# Patient Record
Sex: Female | Born: 1990 | Race: Black or African American | Hispanic: No | Marital: Single | Smoking: Light tobacco smoker
Health system: Southern US, Community
[De-identification: ages and names within clinical notes are randomized; demographics above are authoritative.]

## PROBLEM LIST (undated history)

## (undated) DIAGNOSIS — B977 Papillomavirus as the cause of diseases classified elsewhere: Secondary | ICD-10-CM

## (undated) DIAGNOSIS — A599 Trichomoniasis, unspecified: Secondary | ICD-10-CM

## (undated) DIAGNOSIS — A749 Chlamydial infection, unspecified: Secondary | ICD-10-CM

## (undated) DIAGNOSIS — L0292 Furuncle, unspecified: Secondary | ICD-10-CM

## (undated) DIAGNOSIS — L0293 Carbuncle, unspecified: Secondary | ICD-10-CM

## (undated) HISTORY — PX: NOSE SURGERY: SHX723

---

## 2013-01-12 ENCOUNTER — Encounter (HOSPITAL_COMMUNITY): Payer: Self-pay | Admitting: *Deleted

## 2013-01-12 ENCOUNTER — Emergency Department (HOSPITAL_COMMUNITY)
Admission: EM | Admit: 2013-01-12 | Discharge: 2013-01-12 | Disposition: A | Discharge status: Home / Self Care | Payer: 59 | Attending: Emergency Medicine | Admitting: Emergency Medicine

## 2013-01-12 ENCOUNTER — Emergency Department (HOSPITAL_COMMUNITY): Payer: 59

## 2013-01-12 DIAGNOSIS — F17200 Nicotine dependence, unspecified, uncomplicated: Secondary | ICD-10-CM | POA: Insufficient documentation

## 2013-01-12 DIAGNOSIS — R61 Generalized hyperhidrosis: Secondary | ICD-10-CM | POA: Insufficient documentation

## 2013-01-12 DIAGNOSIS — N12 Tubulo-interstitial nephritis, not specified as acute or chronic: Secondary | ICD-10-CM

## 2013-01-12 DIAGNOSIS — R5381 Other malaise: Secondary | ICD-10-CM | POA: Insufficient documentation

## 2013-01-12 DIAGNOSIS — R05 Cough: Secondary | ICD-10-CM | POA: Insufficient documentation

## 2013-01-12 DIAGNOSIS — R110 Nausea: Secondary | ICD-10-CM | POA: Insufficient documentation

## 2013-01-12 DIAGNOSIS — R0789 Other chest pain: Secondary | ICD-10-CM | POA: Insufficient documentation

## 2013-01-12 DIAGNOSIS — R6883 Chills (without fever): Secondary | ICD-10-CM | POA: Insufficient documentation

## 2013-01-12 DIAGNOSIS — N72 Inflammatory disease of cervix uteri: Secondary | ICD-10-CM | POA: Insufficient documentation

## 2013-01-12 DIAGNOSIS — N898 Other specified noninflammatory disorders of vagina: Secondary | ICD-10-CM | POA: Insufficient documentation

## 2013-01-12 DIAGNOSIS — R059 Cough, unspecified: Secondary | ICD-10-CM | POA: Insufficient documentation

## 2013-01-12 DIAGNOSIS — M25559 Pain in unspecified hip: Secondary | ICD-10-CM | POA: Insufficient documentation

## 2013-01-12 LAB — COMPREHENSIVE METABOLIC PANEL
ALT: 9 U/L (ref 0–35)
AST: 12 U/L (ref 0–37)
Albumin: 3.5 g/dL (ref 3.5–5.2)
Alkaline Phosphatase: 74 U/L (ref 39–117)
Glucose, Bld: 99 mg/dL (ref 70–99)
Potassium: 3.8 mEq/L (ref 3.5–5.1)
Sodium: 136 mEq/L (ref 135–145)
Total Protein: 8.6 g/dL — ABNORMAL HIGH (ref 6.0–8.3)

## 2013-01-12 LAB — CBC WITH DIFFERENTIAL/PLATELET
Basophils Absolute: 0 10*3/uL (ref 0.0–0.1)
Basophils Relative: 0 % (ref 0–1)
Eosinophils Absolute: 0.1 10*3/uL (ref 0.0–0.7)
Lymphs Abs: 1.5 10*3/uL (ref 0.7–4.0)
MCH: 27 pg (ref 26.0–34.0)
Neutrophils Relative %: 69 % (ref 43–77)
Platelets: 460 10*3/uL — ABNORMAL HIGH (ref 150–400)
RBC: 4.33 MIL/uL (ref 3.87–5.11)
WBC: 7 10*3/uL (ref 4.0–10.5)

## 2013-01-12 LAB — URINALYSIS, ROUTINE W REFLEX MICROSCOPIC
Bilirubin Urine: NEGATIVE
Glucose, UA: NEGATIVE mg/dL
Specific Gravity, Urine: 1.029 (ref 1.005–1.030)
pH: 6 (ref 5.0–8.0)

## 2013-01-12 LAB — WET PREP, GENITAL: Yeast Wet Prep HPF POC: NONE SEEN

## 2013-01-12 MED ORDER — DEXTROSE 5 % IV SOLN
1.0000 g | Freq: Once | INTRAVENOUS | Status: AC
  Administered 2013-01-12: 1 g via INTRAVENOUS
  Filled 2013-01-12: qty 10

## 2013-01-12 MED ORDER — TRAMADOL HCL 50 MG PO TABS: 50.0000 mg | ORAL_TABLET | Freq: Four times a day (QID) | ORAL | Status: DC | PRN

## 2013-01-12 MED ORDER — MORPHINE SULFATE 4 MG/ML IJ SOLN
4.0000 mg | Freq: Once | INTRAMUSCULAR | Status: AC
  Administered 2013-01-12: 4 mg via INTRAVENOUS
  Filled 2013-01-12: qty 1

## 2013-01-12 MED ORDER — ONDANSETRON HCL 4 MG/2ML IJ SOLN
4.0000 mg | Freq: Once | INTRAMUSCULAR | Status: AC
  Administered 2013-01-12: 4 mg via INTRAVENOUS
  Filled 2013-01-12: qty 2

## 2013-01-12 MED ORDER — CEFTRIAXONE SODIUM 1 G IJ SOLR: 1.0000 g | Freq: Once | INTRAMUSCULAR | Status: DC

## 2013-01-12 MED ORDER — CEPHALEXIN 500 MG PO CAPS: 500.0000 mg | ORAL_CAPSULE | Freq: Two times a day (BID) | ORAL | Status: DC

## 2013-01-12 MED ORDER — DOXYCYCLINE HYCLATE 100 MG PO CAPS: 100.0000 mg | ORAL_CAPSULE | Freq: Two times a day (BID) | ORAL | Status: DC

## 2013-01-12 NOTE — ED Notes (Signed)
Pt states that she began having right sided pain, back pain, adn sweating earlier in the week. Pt states that she began having upper chest pain last night. Pain is intermittent and worse with breathing. Started while at rest.

## 2013-01-12 NOTE — ED Provider Notes (Signed)
History     CSN: [id number]  Arrival date & time 01/12/13  0948   First MD Initiated Contact with Patient 01/12/13 1000      Chief Complaint  Patient presents with  . Chest Pain    (Consider location/radiation/quality/duration/timing/severity/associated sxs/prior treatment) HPI Jennifer Daugherty is a 22 y.o. female who presents with complaint of right sided abdominal/back pain, chest pain, chills, sweats, bilateral hip and thigh aches. Pt states symptoms began few days ago but worsned since yesterda. States pain to the right upper quadrant and flank pain. States nauseated, sweats. No fever, no vomiting. No changes in bowels. States no vaginal discharge but she is on her period. Admits to cough. Taken ibuprofen with no relief. States feels very achy in pelvic, hips, thighs area.  History reviewed. No pertinent past medical history.  Past Surgical History  Procedure Date  . Nose surgery     No family history on file.  History  Substance Use Topics  . Smoking status: Current Every Day Smoker  . Smokeless tobacco: Not on file  . Alcohol Use: Yes    OB History    Grav Para Term Preterm Abortions TAB SAB Ect Mult Living                  Review of Systems  Constitutional: Positive for chills and diaphoresis. Negative for fever.  HENT: Negative for ear pain, congestion, sore throat, neck pain and neck stiffness.   Eyes: Negative.   Respiratory: Positive for cough and chest tightness. Negative for shortness of breath and wheezing.   Cardiovascular: Positive for chest pain. Negative for palpitations and leg swelling.  Gastrointestinal: Positive for nausea and abdominal pain. Negative for vomiting, diarrhea and constipation.  Genitourinary: Positive for flank pain, vaginal bleeding and pelvic pain. Negative for dysuria.  Musculoskeletal: Positive for myalgias and arthralgias.  Skin: Negative for rash.  Neurological: Positive for weakness. Negative for dizziness, light-headedness and  headaches.  Hematological: Negative.     Allergies  Review of patient's allergies indicates no known allergies.  Home Medications   Current Outpatient Rx  Name  Route  Sig  Dispense  Refill  . IBUPROFEN 200 MG PO TABS   Oral   Take 400 mg by mouth every 6 (six) hours as needed. For pain           BP 118/69  Pulse 78  Temp 98.5 F (36.9 C) (Oral)  Resp 22  SpO2 100%  LMP 01/05/2013  Physical Exam  Nursing note and vitals reviewed. Constitutional: She appears well-developed and well-nourished. No distress.  HENT:  Head: Normocephalic and atraumatic.  Right Ear: External ear normal.  Left Ear: External ear normal.  Mouth/Throat: Oropharynx is clear and moist.  Eyes: Conjunctivae normal are normal.  Neck: Normal range of motion. Neck supple.  Cardiovascular: Normal rate, regular rhythm and normal heart sounds.   Pulmonary/Chest: Effort normal and breath sounds normal. No respiratory distress. She has no wheezes. She has no rales.  Abdominal: Soft. Bowel sounds are normal. She exhibits no distension. There is tenderness. There is no rebound.       RUQ and suprapubic tenderness. Right CVA tenderness  Genitourinary:       Normal external genitalia. Normal vaginal canal except for what and brown discharge. CMT present. Uterine tenderness. No adnexal tenderness  Musculoskeletal: She exhibits no edema.  Neurological: She is alert.  Skin: Skin is dry.    ED Course  Procedures (including critical care time)  Pt with RUQ  abd pain, right CVA tenderness. Pt afebrile. Non toxic. Also complaining of non specific chest pain. Will get UA, labs, CXR.   Results for orders placed during the hospital encounter of 01/12/13  CBC WITH DIFFERENTIAL      Component Value Range   WBC 7.0  4.0 - 10.5 K/uL   RBC 4.33  3.87 - 5.11 MIL/uL   Hemoglobin 11.7 (*) 12.0 - 15.0 g/dL   HCT 14.7 (*) 82.9 - 56.2 %   MCV 81.3  78.0 - 100.0 fL   MCH 27.0  26.0 - 34.0 pg   MCHC 33.2  30.0 - 36.0 g/dL    RDW 13.0 (*) 86.5 - 15.5 %   Platelets 460 (*) 150 - 400 K/uL   Neutrophils Relative 69  43 - 77 %   Neutro Abs 4.8  1.7 - 7.7 K/uL   Lymphocytes Relative 21  12 - 46 %   Lymphs Abs 1.5  0.7 - 4.0 K/uL   Monocytes Relative 9  3 - 12 %   Monocytes Absolute 0.7  0.1 - 1.0 K/uL   Eosinophils Relative 1  0 - 5 %   Eosinophils Absolute 0.1  0.0 - 0.7 K/uL   Basophils Relative 0  0 - 1 %   Basophils Absolute 0.0  0.0 - 0.1 K/uL  COMPREHENSIVE METABOLIC PANEL      Component Value Range   Sodium 136  135 - 145 mEq/L   Potassium 3.8  3.5 - 5.1 mEq/L   Chloride 100  96 - 112 mEq/L   CO2 25  19 - 32 mEq/L   Glucose, Bld 99  70 - 99 mg/dL   BUN 5 (*) 6 - 23 mg/dL   Creatinine, Ser 7.84  0.50 - 1.10 mg/dL   Calcium 9.5  8.4 - 69.6 mg/dL   Total Protein 8.6 (*) 6.0 - 8.3 g/dL   Albumin 3.5  3.5 - 5.2 g/dL   AST 12  0 - 37 U/L   ALT 9  0 - 35 U/L   Alkaline Phosphatase 74  39 - 117 U/L   Total Bilirubin 0.4  0.3 - 1.2 mg/dL   GFR calc non Af Amer >90  >90 mL/min   GFR calc Af Amer >90  >90 mL/min  LIPASE, BLOOD      Component Value Range   Lipase 14  11 - 59 U/L  URINALYSIS, ROUTINE W REFLEX MICROSCOPIC      Component Value Range   Color, Urine RED (*) YELLOW   APPearance TURBID (*) CLEAR   Specific Gravity, Urine 1.029  1.005 - 1.030   pH 6.0  5.0 - 8.0   Glucose, UA NEGATIVE  NEGATIVE mg/dL   Hgb urine dipstick LARGE (*) NEGATIVE   Bilirubin Urine NEGATIVE  NEGATIVE   Ketones, ur 15 (*) NEGATIVE mg/dL   Protein, ur 295 (*) NEGATIVE mg/dL   Urobilinogen, UA 1.0  0.0 - 1.0 mg/dL   Nitrite NEGATIVE  NEGATIVE   Leukocytes, UA LARGE (*) NEGATIVE  WET PREP, GENITAL      Component Value Range   Yeast Wet Prep HPF POC NONE SEEN  NONE SEEN   Trich, Wet Prep NONE SEEN  NONE SEEN   Clue Cells Wet Prep HPF POC MODERATE (*) NONE SEEN   WBC, Wet Prep HPF POC MANY (*) NONE SEEN  POCT PREGNANCY, URINE      Component Value Range   Preg Test, Ur NEGATIVE  NEGATIVE  URINE MICROSCOPIC-ADD  ON  Component Value Range   Squamous Epithelial / LPF MANY (*) RARE   WBC, UA 21-50  <3 WBC/hpf   RBC / HPF TOO NUMEROUS TO COUNT  <3 RBC/hpf   Bacteria, UA MANY (*) RARE   Dg Chest 2 View  01/12/2013  *RADIOLOGY REPORT*  Clinical Data: Chest pain  CHEST - 2 VIEW  Comparison: None.  Findings: Lungs clear.  Heart size and pulmonary vascularity are normal.  No adenopathy.  No bone lesions.  No pneumothorax.  IMPRESSION: No abnormality noted.   Original Report Authenticated By: Bretta Bang, M.D.     Date: 01/12/2013  Rate: 79  Rhythm: normal sinus rhythm  QRS Axis: normal  Intervals: normal  ST/T Wave abnormalities: normal  Conduction Disutrbances: none  Narrative Interpretation:   Old EKG Reviewed: no old      1. Cervicitis   2. Pyelonephritis       MDM  Pt with abdominal tenderness, right flank pain. LFTs and lipase normal. No vomiting. Doubt cholecystitis. Still possible cholelithiasis. Pt does have infected UA and bacteria on wet prep. Suspect UTI vs pyelonephritis and possible cervicitis vs PID. Pt's VS are normal. She is afebrile. Not vomiting. Will treat for both pelvic infection and pyelonephritis outpatient. Given rocephin 1g IVPB. Pt non toxic otherwise.. No acute abdomen. Stable for d/c home. CXR non specific, none at this time.   Filed Vitals:   01/12/13 1015 01/12/13 1226 01/12/13 1300 01/12/13 1400  BP: 94/51 106/68 108/72 95/52  Pulse: 80 67 76 69  Temp:  98.1 F (36.7 C)    TempSrc:  Oral    Resp: 20 20    SpO2: 97% 98% 98% 98%           Lottie Mussel, PA 01/12/13 1502

## 2013-01-12 NOTE — ED Notes (Signed)
Pt taken to radiology

## 2013-01-13 LAB — URINE CULTURE: Culture: NO GROWTH

## 2013-01-14 LAB — GC/CHLAMYDIA PROBE AMP
CT Probe RNA: POSITIVE — AB
GC Probe RNA: NEGATIVE

## 2013-01-15 ENCOUNTER — Telehealth (HOSPITAL_COMMUNITY): Payer: Self-pay | Admitting: Emergency Medicine

## 2013-01-15 NOTE — ED Notes (Signed)
+  Chlamydia. Patient treated with Rocephin and Doxycycline. Per protocol MD. [facility] faxed. °

## 2013-01-15 NOTE — ED Notes (Signed)
Patient had +Chlamydia. Checking to see if appropriate treatment provided.

## 2013-01-16 NOTE — ED Provider Notes (Signed)
Medical screening examination/treatment/procedure(s) were performed by non-physician practitioner and as supervising physician I was immediately available for consultation/collaboration.   Suzi Roots, MD 01/16/13 [phone]

## 2014-08-01 ENCOUNTER — Emergency Department (HOSPITAL_COMMUNITY)
Admission: EM | Admit: 2014-08-01 | Discharge: 2014-08-02 | Discharge status: Against Medical Advice | Payer: 59 | Attending: Emergency Medicine | Admitting: Emergency Medicine

## 2014-08-01 ENCOUNTER — Encounter (HOSPITAL_COMMUNITY): Payer: Self-pay | Admitting: Emergency Medicine

## 2014-08-01 DIAGNOSIS — R109 Unspecified abdominal pain: Secondary | ICD-10-CM | POA: Diagnosis not present

## 2014-08-01 HISTORY — DX: Trichomoniasis, unspecified: A59.9

## 2014-08-01 HISTORY — DX: Furuncle, unspecified: L02.92

## 2014-08-01 HISTORY — DX: Carbuncle, unspecified: L02.93

## 2014-08-01 HISTORY — DX: Chlamydial infection, unspecified: A74.9

## 2014-08-01 HISTORY — DX: Papillomavirus as the cause of diseases classified elsewhere: B97.7

## 2014-08-01 LAB — CBC WITH DIFFERENTIAL/PLATELET
Basophils Absolute: 0 10*3/uL (ref 0.0–0.1)
Basophils Relative: 0 % (ref 0–1)
EOS PCT: 4 % (ref 0–5)
Eosinophils Absolute: 0.2 10*3/uL (ref 0.0–0.7)
HCT: 35.9 % — ABNORMAL LOW (ref 36.0–46.0)
HEMOGLOBIN: 11.4 g/dL — AB (ref 12.0–15.0)
LYMPHS ABS: 2.7 10*3/uL (ref 0.7–4.0)
LYMPHS PCT: 49 % — AB (ref 12–46)
MCH: 26.5 pg (ref 26.0–34.0)
MCHC: 31.8 g/dL (ref 30.0–36.0)
MCV: 83.3 fL (ref 78.0–100.0)
Monocytes Absolute: 0.3 10*3/uL (ref 0.1–1.0)
Monocytes Relative: 6 % (ref 3–12)
Neutro Abs: 2.3 10*3/uL (ref 1.7–7.7)
Neutrophils Relative %: 41 % — ABNORMAL LOW (ref 43–77)
PLATELETS: 457 10*3/uL — AB (ref 150–400)
RBC: 4.31 MIL/uL (ref 3.87–5.11)
RDW: 15.5 % (ref 11.5–15.5)
WBC: 5.5 10*3/uL (ref 4.0–10.5)

## 2014-08-01 LAB — COMPREHENSIVE METABOLIC PANEL
ALK PHOS: 68 U/L (ref 39–117)
ALT: 8 U/L (ref 0–35)
AST: 16 U/L (ref 0–37)
Albumin: 3.5 g/dL (ref 3.5–5.2)
Anion gap: 13 (ref 5–15)
BUN: 6 mg/dL (ref 6–23)
CO2: 24 meq/L (ref 19–32)
Calcium: 9.3 mg/dL (ref 8.4–10.5)
Chloride: 103 mEq/L (ref 96–112)
Creatinine, Ser: 0.72 mg/dL (ref 0.50–1.10)
GLUCOSE: 81 mg/dL (ref 70–99)
POTASSIUM: 3.3 meq/L — AB (ref 3.7–5.3)
SODIUM: 140 meq/L (ref 137–147)
Total Bilirubin: 0.3 mg/dL (ref 0.3–1.2)
Total Protein: 7.8 g/dL (ref 6.0–8.3)

## 2014-08-01 LAB — I-STAT TROPONIN, ED: Troponin i, poc: 0 ng/mL (ref 0.00–0.08)

## 2014-08-01 LAB — LIPASE, BLOOD: Lipase: 18 U/L (ref 11–59)

## 2014-08-01 NOTE — ED Notes (Signed)
Pt came up to desk and returned pager, stated she was leaving, did not stay for conversation, ambulatory without distress out of department

## 2014-08-01 NOTE — ED Notes (Signed)
The patient said she had not been able to have a BM for two weeks and has been having AB pain.  She says yesterday she had diarrhea and had been nauseated.  The patient also says she has a "knot" on her right side.  She says she is having cramping and rates her pain 10/10.

## 2014-08-10 ENCOUNTER — Emergency Department (HOSPITAL_COMMUNITY)
Admission: EM | Admit: 2014-08-10 | Discharge: 2014-08-10 | Disposition: A | Discharge status: Home / Self Care | Payer: 59 | Attending: Emergency Medicine | Admitting: Emergency Medicine

## 2014-08-10 ENCOUNTER — Encounter (HOSPITAL_COMMUNITY): Payer: Self-pay | Admitting: Emergency Medicine

## 2014-08-10 ENCOUNTER — Emergency Department (HOSPITAL_COMMUNITY): Payer: 59

## 2014-08-10 DIAGNOSIS — Z8619 Personal history of other infectious and parasitic diseases: Secondary | ICD-10-CM | POA: Insufficient documentation

## 2014-08-10 DIAGNOSIS — K5900 Constipation, unspecified: Secondary | ICD-10-CM | POA: Diagnosis not present

## 2014-08-10 DIAGNOSIS — Z3202 Encounter for pregnancy test, result negative: Secondary | ICD-10-CM | POA: Diagnosis not present

## 2014-08-10 DIAGNOSIS — F17200 Nicotine dependence, unspecified, uncomplicated: Secondary | ICD-10-CM | POA: Diagnosis not present

## 2014-08-10 DIAGNOSIS — R109 Unspecified abdominal pain: Secondary | ICD-10-CM | POA: Insufficient documentation

## 2014-08-10 DIAGNOSIS — R1033 Periumbilical pain: Secondary | ICD-10-CM | POA: Diagnosis not present

## 2014-08-10 DIAGNOSIS — Z872 Personal history of diseases of the skin and subcutaneous tissue: Secondary | ICD-10-CM | POA: Diagnosis not present

## 2014-08-10 DIAGNOSIS — R1032 Left lower quadrant pain: Secondary | ICD-10-CM | POA: Diagnosis not present

## 2014-08-10 DIAGNOSIS — R197 Diarrhea, unspecified: Secondary | ICD-10-CM | POA: Diagnosis not present

## 2014-08-10 DIAGNOSIS — K921 Melena: Secondary | ICD-10-CM | POA: Insufficient documentation

## 2014-08-10 LAB — CBC WITH DIFFERENTIAL/PLATELET
Basophils Absolute: 0 10*3/uL (ref 0.0–0.1)
Basophils Relative: 0 % (ref 0–1)
EOS ABS: 0.1 10*3/uL (ref 0.0–0.7)
EOS PCT: 1 % (ref 0–5)
HCT: 35.5 % — ABNORMAL LOW (ref 36.0–46.0)
HEMOGLOBIN: 11.6 g/dL — AB (ref 12.0–15.0)
Lymphocytes Relative: 12 % (ref 12–46)
Lymphs Abs: 1.2 10*3/uL (ref 0.7–4.0)
MCH: 26.4 pg (ref 26.0–34.0)
MCHC: 32.7 g/dL (ref 30.0–36.0)
MCV: 80.9 fL (ref 78.0–100.0)
MONOS PCT: 6 % (ref 3–12)
Monocytes Absolute: 0.6 10*3/uL (ref 0.1–1.0)
Neutro Abs: 7.9 10*3/uL — ABNORMAL HIGH (ref 1.7–7.7)
Neutrophils Relative %: 81 % — ABNORMAL HIGH (ref 43–77)
PLATELETS: 447 10*3/uL — AB (ref 150–400)
RBC: 4.39 MIL/uL (ref 3.87–5.11)
RDW: 15 % (ref 11.5–15.5)
WBC: 9.8 10*3/uL (ref 4.0–10.5)

## 2014-08-10 LAB — COMPREHENSIVE METABOLIC PANEL
ALT: 6 U/L (ref 0–35)
ANION GAP: 16 — AB (ref 5–15)
AST: 10 U/L (ref 0–37)
Albumin: 3.7 g/dL (ref 3.5–5.2)
Alkaline Phosphatase: 74 U/L (ref 39–117)
BILIRUBIN TOTAL: 0.4 mg/dL (ref 0.3–1.2)
BUN: 7 mg/dL (ref 6–23)
CHLORIDE: 99 meq/L (ref 96–112)
CO2: 21 meq/L (ref 19–32)
CREATININE: 0.69 mg/dL (ref 0.50–1.10)
Calcium: 9.5 mg/dL (ref 8.4–10.5)
GFR calc Af Amer: >90 mL/min (ref >90)
GFR calc non Af Amer: >90 mL/min (ref >90)
Glucose, Bld: 100 mg/dL — ABNORMAL HIGH (ref 70–99)
Potassium: 3.6 mEq/L — ABNORMAL LOW (ref 3.7–5.3)
Sodium: 136 mEq/L — ABNORMAL LOW (ref 137–147)
Total Protein: 8.1 g/dL (ref 6.0–8.3)

## 2014-08-10 LAB — URINALYSIS, ROUTINE W REFLEX MICROSCOPIC
BILIRUBIN URINE: NEGATIVE
GLUCOSE, UA: NEGATIVE mg/dL
Hgb urine dipstick: NEGATIVE
KETONES UR: NEGATIVE mg/dL
Leukocytes, UA: NEGATIVE
Nitrite: NEGATIVE
Protein, ur: NEGATIVE mg/dL
Specific Gravity, Urine: 1.022 (ref 1.005–1.030)
Urobilinogen, UA: 1 mg/dL (ref 0.0–1.0)
pH: 8 (ref 5.0–8.0)

## 2014-08-10 LAB — POC URINE PREG, ED: Preg Test, Ur: NEGATIVE

## 2014-08-10 MED ORDER — METOCLOPRAMIDE HCL 5 MG/ML IJ SOLN
10.0000 mg | Freq: Once | INTRAMUSCULAR | Status: AC
  Administered 2014-08-10: 10 mg via INTRAVENOUS
  Filled 2014-08-10: qty 2

## 2014-08-10 MED ORDER — SODIUM CHLORIDE 0.9 % IV BOLUS (SEPSIS)
1000.0000 mL | Freq: Once | INTRAVENOUS | Status: AC
  Administered 2014-08-10: 1000 mL via INTRAVENOUS

## 2014-08-10 NOTE — ED Notes (Signed)
MD at bedside. 

## 2014-08-10 NOTE — ED Provider Notes (Signed)
CSN: [id number]     Arrival date & time 08/10/14  0153 History   First MD Initiated Contact with Patient 08/10/14 [phone]     Chief Complaint  Patient presents with  . Abdominal Pain   HPI  History provided by the patient. The patient is a 23 year old female presenting with complaints of diarrhea. Patient reports having problems with constipation for the past 3 weeks. Several days ago she was finally able to have a bowel movement but reports having diarrhea. She has not had any significant bowel movement since that time but has some intermittent cramping to her lower abdomen diffusely. Yesterday evening patient reports feeling the urge to have another bowel movement and reports having "explosive diarrhea". She also reports seeing small amounts of bright red blood on tissue paper. Patient did originally go to emergency room last week when she had her first episode of diarrhea but decided to leave before being evaluated. She is not taking any medications or treatment for her symptoms. She denies any other associated complaints. There is no nausea or vomiting. No fever, chills or sweats. Denies any urinary symptoms. No menstrual changes. Finish her menstrual cycle a few days ago. Denies any vaginal bleeding or discharge.   Past Medical History  Diagnosis Date  . Chlamydia   . HPV (human papilloma virus) infection   . Trichomonas infection   . Recurrent boils    Past Surgical History  Procedure Laterality Date  . Nose surgery     History reviewed. No pertinent family history. History  Substance Use Topics  . Smoking status: Light Tobacco Smoker    Types: Cigarettes  . Smokeless tobacco: Never Used  . Alcohol Use: Yes   OB History   Grav Para Term Preterm Abortions TAB SAB Ect Mult Living                 Review of Systems  Constitutional: Negative for fever, chills and diaphoresis.  Gastrointestinal: Positive for abdominal pain, diarrhea, constipation and blood in stool. Negative for  nausea and vomiting.  Genitourinary: Negative for dysuria, frequency, hematuria, flank pain, vaginal bleeding and vaginal discharge.  All other systems reviewed and are negative.     Allergies  Review of patient's allergies indicates no known allergies.  Home Medications   Prior to Admission medications   Medication Sig Start Date End Date Taking? Authorizing Provider  ibuprofen (ADVIL,MOTRIN) 200 MG tablet Take 400 mg by mouth every 6 (six) hours as needed. For pain   Yes Historical Provider, MD  magnesium citrate SOLN Take 1 Bottle by mouth once.   Yes Historical Provider, MD   BP 106/73  Pulse 80  Temp(Src) 98.6 F (37 C) (Oral)  Resp 16  Ht  (1.651 m)  Wt 225 lb (102.059 kg)  BMI 37.44 kg/m2  SpO2 100%  LMP 07/24/2014 Physical Exam  Nursing note and vitals reviewed. Constitutional: She is oriented to person, place, and time. She appears well-developed and well-nourished. No distress.  HENT:  Head: Normocephalic.  Cardiovascular: Normal rate and regular rhythm.   Pulmonary/Chest: Effort normal and breath sounds normal. No respiratory distress. She has no wheezes.  Abdominal: Soft. There is tenderness in the periumbilical area, suprapubic area and left lower quadrant. There is no rigidity, no rebound and no guarding.  Genitourinary:  Pt refused rectal exam  Musculoskeletal: Normal range of motion.  Neurological: She is alert and oriented to person, place, and time.  Skin: Skin is warm and dry. No rash noted.  Psychiatric: She has a normal mood and affect. Her behavior is normal.    ED Course  Procedures   COORDINATION OF CARE:  Nursing notes reviewed. Vital signs reviewed. Initial pt interview and examination performed.   Filed Vitals:   08/10/14 0202  BP: 106/73  Pulse: 80  Temp: 98.6 F (37 C)  TempSrc: Oral  Resp: 16  Height:  (1.651 m)  Weight: 225 lb (102.059 kg)  SpO2: 100%    2:41 AM-Pt seen and evaluated. Pt well appearing in no acute  distress. Afebrile. Non-toxic. Soft abd with mild tenderness. No peritoneal signs.  Pt reports feeling significantly better after IV fluids and Reglan. Labs and x-rays are unremarkable. At this time doubt any concerning or emergent condition. Will plan to give the patient GI referral. Strict return precautions given.  Treatment plan initiated: Medications  sodium chloride 0.9 % bolus 1,000 mL (0 mLs Intravenous Stopped 08/10/14 0409)  metoCLOPramide (REGLAN) injection 10 mg (10 mg Intravenous Given 08/10/14 0303)    Results for orders placed during the hospital encounter of 08/10/14  CBC WITH DIFFERENTIAL      Result Value Ref Range   WBC 9.8  4.0 - 10.5 K/uL   RBC 4.39  3.87 - 5.11 MIL/uL   Hemoglobin 11.6 (*) 12.0 - 15.0 g/dL   HCT 16.1 (*) 09.6 - 04.5 %   MCV 80.9  78.0 - 100.0 fL   MCH 26.4  26.0 - 34.0 pg   MCHC 32.7  30.0 - 36.0 g/dL   RDW 40.9  81.1 - 91.4 %   Platelets 447 (*) 150 - 400 K/uL   Neutrophils Relative % 81 (*) 43 - 77 %   Neutro Abs 7.9 (*) 1.7 - 7.7 K/uL   Lymphocytes Relative 12  12 - 46 %   Lymphs Abs 1.2  0.7 - 4.0 K/uL   Monocytes Relative 6  3 - 12 %   Monocytes Absolute 0.6  0.1 - 1.0 K/uL   Eosinophils Relative 1  0 - 5 %   Eosinophils Absolute 0.1  0.0 - 0.7 K/uL   Basophils Relative 0  0 - 1 %   Basophils Absolute 0.0  0.0 - 0.1 K/uL  URINALYSIS, ROUTINE W REFLEX MICROSCOPIC      Result Value Ref Range   Color, Urine AMBER (*) YELLOW   APPearance CLOUDY (*) CLEAR   Specific Gravity, Urine 1.022  1.005 - 1.030   pH 8.0  5.0 - 8.0   Glucose, UA NEGATIVE  NEGATIVE mg/dL   Hgb urine dipstick NEGATIVE  NEGATIVE   Bilirubin Urine NEGATIVE  NEGATIVE   Ketones, ur NEGATIVE  NEGATIVE mg/dL   Protein, ur NEGATIVE  NEGATIVE mg/dL   Urobilinogen, UA 1.0  0.0 - 1.0 mg/dL   Nitrite NEGATIVE  NEGATIVE   Leukocytes, UA NEGATIVE  NEGATIVE  COMPREHENSIVE METABOLIC PANEL      Result Value Ref Range   Sodium 136 (*) 137 - 147 mEq/L   Potassium 3.6 (*) 3.7 - 5.3  mEq/L   Chloride 99  96 - 112 mEq/L   CO2 21  19 - 32 mEq/L   Glucose, Bld 100 (*) 70 - 99 mg/dL   BUN 7  6 - 23 mg/dL   Creatinine, Ser 7.82  0.50 - 1.10 mg/dL   Calcium 9.5  8.4 - 95.6 mg/dL   Total Protein 8.1  6.0 - 8.3 g/dL   Albumin 3.7  3.5 - 5.2 g/dL   AST 10  0 - 37 U/L   ALT 6  0 - 35 U/L   Alkaline Phosphatase 74  39 - 117 U/L   Total Bilirubin 0.4  0.3 - 1.2 mg/dL   GFR calc non Af Amer >90  >90 mL/min   GFR calc Af Amer >90  >90 mL/min   Anion gap 16 (*) 5 - 15  POC URINE PREG, ED      Result Value Ref Range   Preg Test, Ur NEGATIVE  NEGATIVE       Imaging Review Dg Abd Acute W/chest  08/10/2014   CLINICAL DATA:  Lower abdominal pain and nausea.  EXAM: ACUTE ABDOMEN SERIES (ABDOMEN 2 VIEW & CHEST 1 VIEW)  COMPARISON:  Chest radiograph January 12, 2013  FINDINGS: Cardiomediastinal silhouette is unremarkable. Lungs are clear, no pleural effusions. No pneumothorax. Soft tissue planes and included osseous structures are unremarkable.  Bowel gas pattern is nondilated and nonobstructive. No intra-abdominal mass effect, pathologic calcifications or free air. Soft tissue planes and included osseous structures are nonsuspicious.  IMPRESSION: Negative abdominal radiographs.  No acute cardiopulmonary disease.   Electronically Signed   By: Awilda Metro   On: 08/10/2014 04:02     MDM   Final diagnoses:  Abdominal cramping  Diarrhea        Angus Seller, PA-C 08/10/14 [phone]

## 2014-08-10 NOTE — Discharge Instructions (Signed)
Your testing and x-rays today did not show any signs for a concerning or emergent condition to explain your symptoms. Please follow up with a primary care provider or a gastroenterology specialist for continued evaluation and treatment. Return for any changing or worsening symptoms.   Abdominal Pain Many things can cause belly (abdominal) pain. Most times, the belly pain is not dangerous. Many cases of belly pain can be watched and treated at home. HOME CARE   Do not take medicines that help you go poop (laxatives) unless told to by your doctor.  Only take medicine as told by your doctor.  Eat or drink as told by your doctor. Your doctor will tell you if you should be on a special diet. GET HELP IF:  You do not know what is causing your belly pain.  You have belly pain while you are sick to your stomach (nauseous) or have runny poop (diarrhea).  You have pain while you pee or poop.  Your belly pain wakes you up at night.  You have belly pain that gets worse or better when you eat.  You have belly pain that gets worse when you eat fatty foods.  You have a fever. GET HELP RIGHT AWAY IF:   The pain does not go away within 2 hours.  You keep throwing up (vomiting).  The pain changes and is only in the right or left part of the belly.  You have bloody or tarry looking poop. MAKE SURE YOU:   Understand these instructions.  Will watch your condition.  Will get help right away if you are not doing well or get worse. Document Released: 05/11/2008 Document Revised: 11/28/2013 Document Reviewed: 08/02/2013 [facility] Patient Information 2015 [place], [place]. This information is not intended to replace advice given to you by your health care provider. Make sure you discuss any questions you have with your health care provider.    Diarrhea Diarrhea is watery poop (stool). It can make you feel weak, tired, thirsty, or give you a dry mouth (signs of dehydration). Watery poop is a sign  of another problem, most often an infection. It often lasts 2-3 days. It can last longer if it is a sign of something serious. Take care of yourself as told by your doctor. HOME CARE   Drink 1 cup (8 ounces) of fluid each time you have watery poop.  Do not drink the following fluids:  Those that contain simple sugars (fructose, glucose, galactose, lactose, sucrose, maltose).  Sports drinks.  Fruit juices.  Whole milk products.  Sodas.  Drinks with caffeine (coffee, tea, soda) or alcohol.  Oral rehydration solution may be used if the doctor says it is okay. You may make your own solution. Follow this recipe:   - teaspoon table salt.   teaspoon baking soda.   teaspoon salt substitute containing potassium chloride.  1 tablespoons sugar.  1 liter (34 ounces) of water.  Avoid the following foods:  High fiber foods, such as raw fruits and vegetables.  Nuts, seeds, and whole grain breads and cereals.   Those that are sweetened with sugar alcohols (xylitol, sorbitol, mannitol).  Try eating the following foods:  Starchy foods, such as rice, toast, pasta, low-sugar cereal, oatmeal, baked potatoes, crackers, and bagels.  Bananas.  Applesauce.  Eat probiotic-rich foods, such as yogurt and milk products that are fermented.  Wash your hands well after each time you have watery poop.  Only take medicine as told by your doctor.  Take a warm  bath to help lessen burning or pain from having watery poop. GET HELP RIGHT AWAY IF:   You cannot drink fluids without throwing up (vomiting).  You keep throwing up.  You have blood in your poop, or your poop looks black and tarry.  You do not pee (urinate) in 6-8 hours, or there is only a small amount of very dark pee.  You have belly (abdominal) pain that gets worse or stays in the same spot (localizes).  You are weak, dizzy, confused, or light-headed.  You have a very bad headache.  Your watery poop gets worse or does not  get better.  You have a fever or lasting symptoms for more than 2-3 days.  You have a fever and your symptoms suddenly get worse. MAKE SURE YOU:   Understand these instructions.  Will watch your condition.  Will get help right away if you are not doing well or get worse. Document Released: 05/11/2008 Document Revised: 04/09/2014 Document Reviewed: 07/31/2012 [facility] Patient Information 2015 [place], [place]. This information is not intended to replace advice given to you by your health care provider. Make sure you discuss any questions you have with your health care provider.

## 2014-08-10 NOTE — ED Notes (Signed)
Patient transported to X-ray 

## 2014-08-10 NOTE — ED Notes (Addendum)
Patient was at [facility] on 8/26 for the same pain. Patient states that over the past month she has been going from one extreme to the next. Either she has explosive diarrhea or she can not go at all. Tonight was the first night that she saw blood in her stool. Patient left AMA from [facility] last week prior to being fully evaluated and discharged.

## 2014-08-10 NOTE — ED Notes (Signed)
Pt ate [employer] and soon after she started having severe abdominal cramping and then had diarrhea

## 2014-08-16 NOTE — ED Provider Notes (Signed)
Medical screening examination/treatment/procedure(s) were performed by non-physician practitioner and as supervising physician I was immediately available for consultation/collaboration.   EKG Interpretation None       Raeford Razor, MD 08/16/14 [phone]

## 2014-10-12 ENCOUNTER — Inpatient Hospital Stay (HOSPITAL_COMMUNITY): Admission: AD | Admit: 2014-10-12 | Payer: 59 | Source: Ambulatory Visit | Admitting: Obstetrics and Gynecology

## 2016-03-07 IMAGING — CR DG ABDOMEN ACUTE W/ 1V CHEST
4 series · 4 of 4 positions shown · non-contrast
Comparison: Chest radiograph January 12, 2013

CLINICAL DATA: Lower abdominal pain and nausea.

EXAM:
ACUTE ABDOMEN SERIES (ABDOMEN 2 VIEW & CHEST 1 VIEW)

[w chest pa]
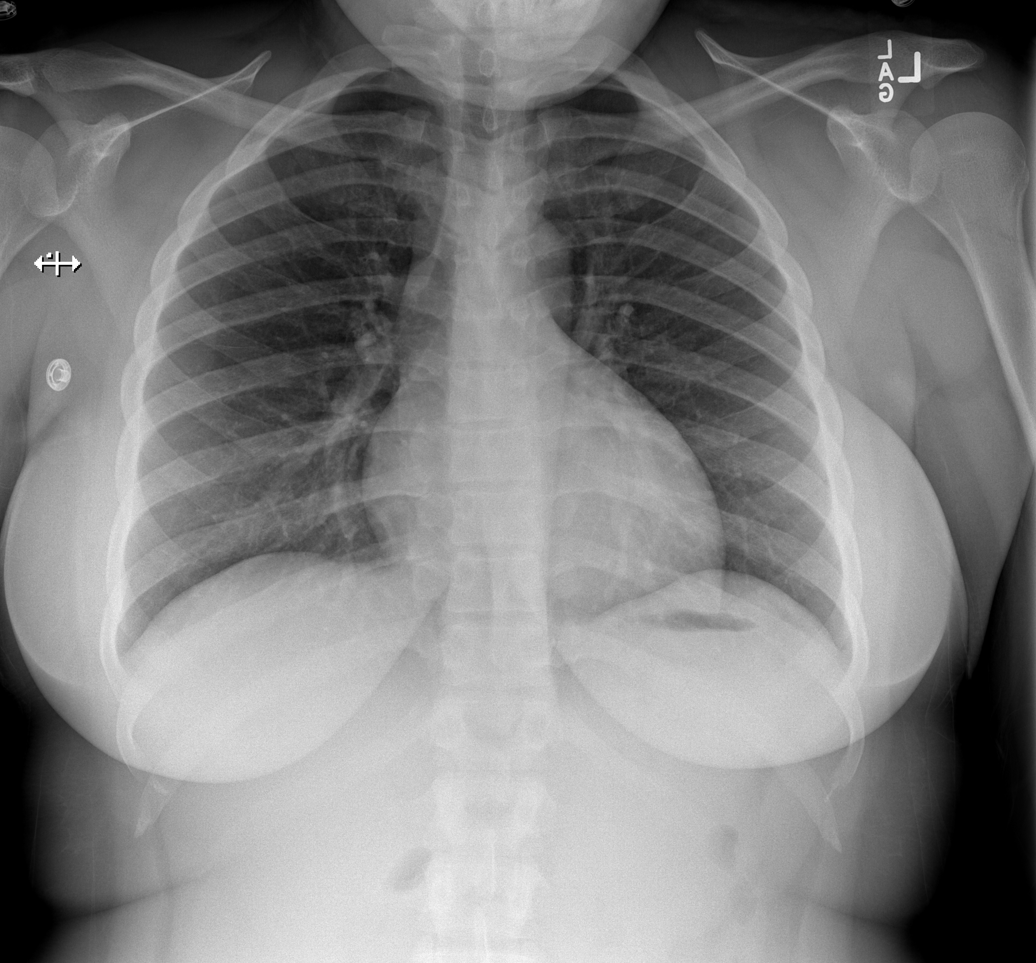

[w abdomen upright]
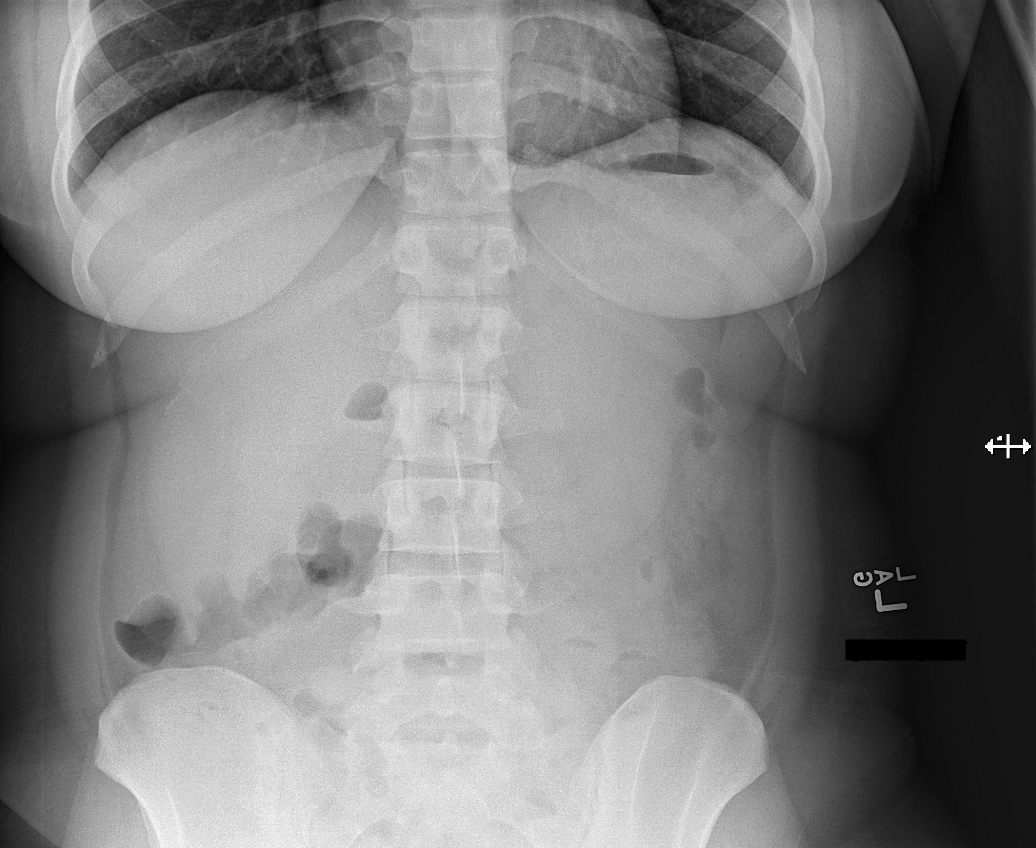

[t abdomen supine (1 of 2)]
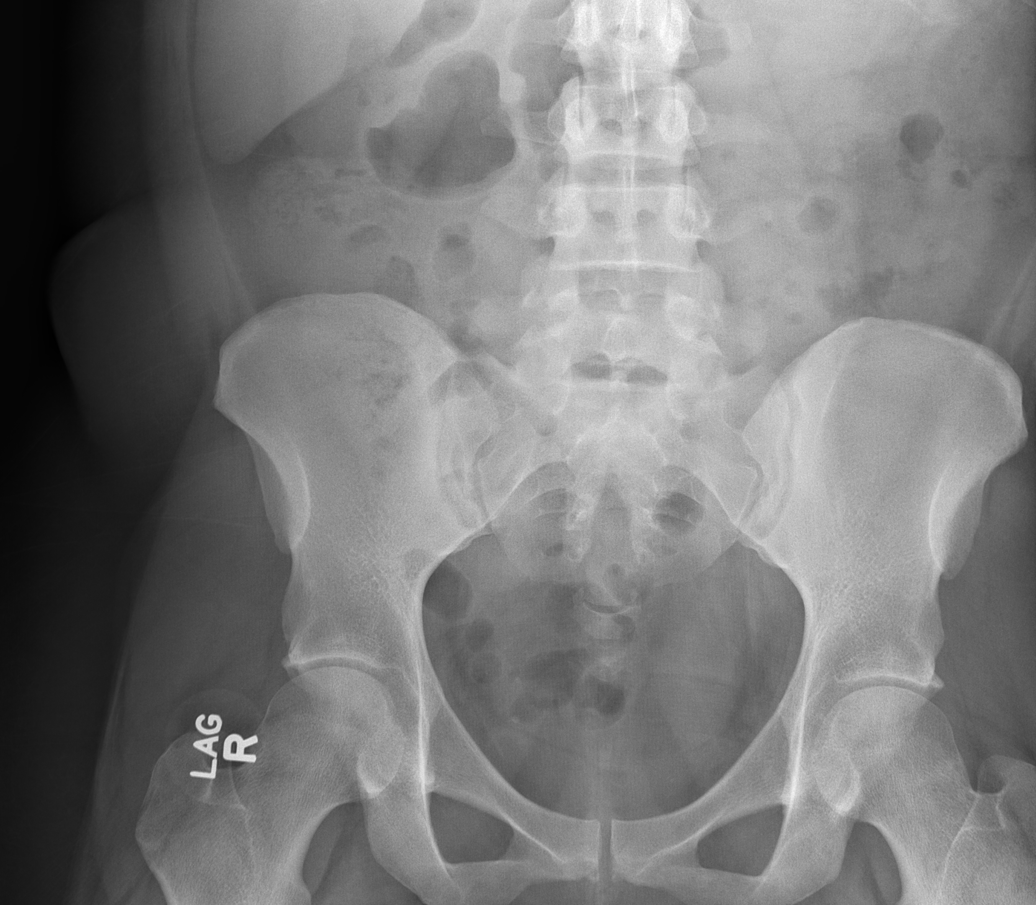

[t abdomen supine (2 of 2)]
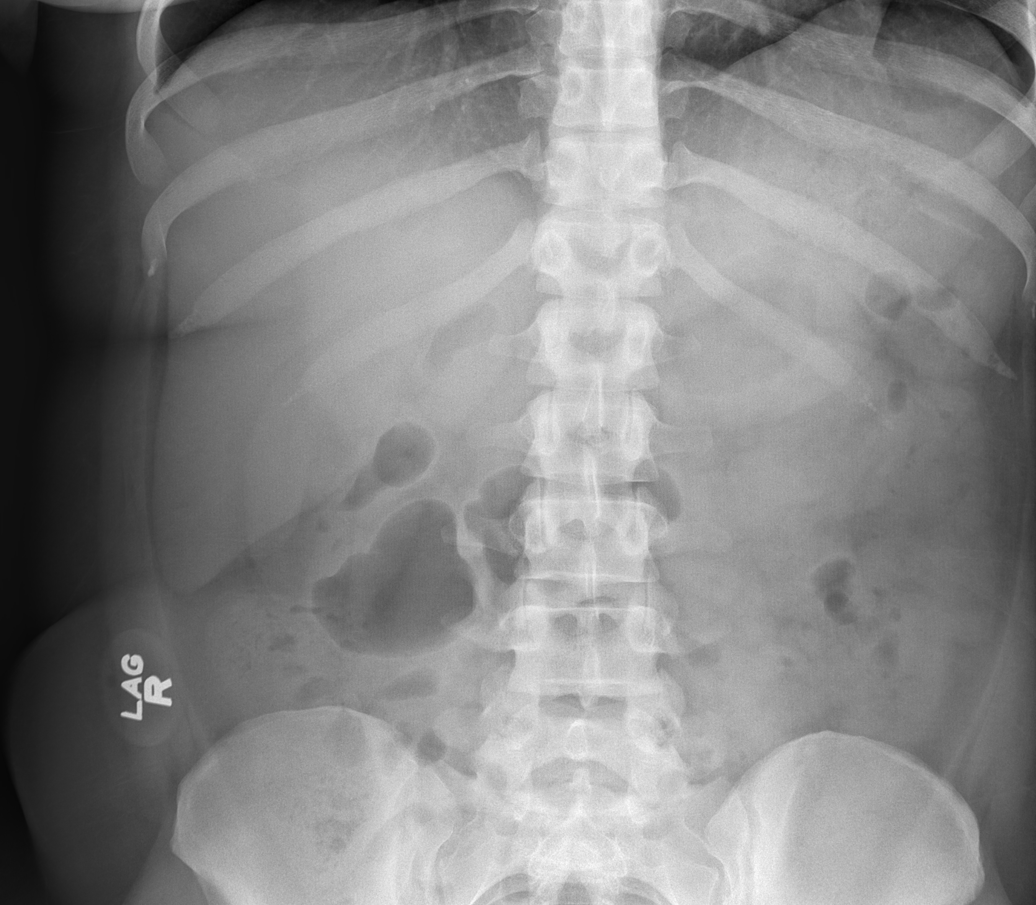

[4 of 4 positions shown; findings below may reference images not displayed]

FINDINGS: Cardiomediastinal silhouette is unremarkable. Lungs are clear, no
pleural effusions. No pneumothorax. Soft tissue planes and included
osseous structures are unremarkable.

Bowel gas pattern is nondilated and nonobstructive. No
intra-abdominal mass effect, pathologic calcifications or free air.
Soft tissue planes and included osseous structures are
nonsuspicious.
IMPRESSION: Negative abdominal radiographs.  No acute cardiopulmonary disease.

  By: Luis Antonio Gucci
